# Patient Record
Sex: Female | Born: 1985 | Race: Black or African American | Hispanic: No | Marital: Single
Health system: Southern US, Community
[De-identification: ages and names within clinical notes are randomized; demographics above are authoritative.]

---

## 2010-06-12 ENCOUNTER — Emergency Department: Payer: Self-pay | Admitting: Emergency Medicine

## 2010-06-17 ENCOUNTER — Emergency Department: Payer: Self-pay | Admitting: Internal Medicine

## 2011-06-26 ENCOUNTER — Observation Stay: Payer: Self-pay

## 2011-06-26 LAB — URINALYSIS, COMPLETE
Bacteria: NONE SEEN
Leukocyte Esterase: NEGATIVE
Ph: 6 (ref 4.5–8.0)
Protein: NEGATIVE
RBC,UR: 2 /HPF (ref 0–5)
Specific Gravity: 1.013 (ref 1.003–1.030)

## 2011-06-28 LAB — URINE CULTURE

## 2011-09-21 ENCOUNTER — Observation Stay: Payer: Self-pay | Admitting: Obstetrics and Gynecology

## 2011-09-21 LAB — CBC WITH DIFFERENTIAL/PLATELET
Eosinophil %: 0.8 %
Lymphocyte #: 1.7 10*3/uL (ref 1.0–3.6)
Lymphocyte %: 15.8 %
MCV: 81 fL (ref 80–100)
Monocyte #: 1.1 x10 3/mm — ABNORMAL HIGH (ref 0.2–0.9)
Monocyte %: 10.5 %
Neutrophil #: 7.6 10*3/uL — ABNORMAL HIGH (ref 1.4–6.5)
Platelet: 204 10*3/uL (ref 150–440)
RBC: 4.11 10*6/uL (ref 3.80–5.20)
RDW: 14.7 % — ABNORMAL HIGH (ref 11.5–14.5)
WBC: 10.5 10*3/uL (ref 3.6–11.0)

## 2011-10-27 ENCOUNTER — Inpatient Hospital Stay: Payer: Self-pay

## 2011-10-27 LAB — CBC WITH DIFFERENTIAL/PLATELET
Basophil #: 0.1 10*3/uL (ref 0.0–0.1)
Basophil %: 0.6 %
Eosinophil #: 0.1 10*3/uL (ref 0.0–0.7)
Eosinophil %: 0.4 %
HCT: 34.6 % — ABNORMAL LOW (ref 35.0–47.0)
Lymphocyte %: 17 %
MCH: 26.6 pg (ref 26.0–34.0)
MCHC: 34.1 g/dL (ref 32.0–36.0)
Neutrophil %: 74.6 %
Platelet: 251 10*3/uL (ref 150–440)
WBC: 14.3 10*3/uL — ABNORMAL HIGH (ref 3.6–11.0)

## 2011-10-29 LAB — HEMATOCRIT: HCT: 32.6 % — ABNORMAL LOW (ref 35.0–47.0)

## 2012-07-12 ENCOUNTER — Emergency Department: Payer: Self-pay | Admitting: Emergency Medicine

## 2012-08-25 IMAGING — CR DG SHOULDER 3+V*L*
1 series · 3 of 3 positions shown · non-contrast
Comparison: none

REASON FOR EXAM: pain
COMMENTS:

PROCEDURE:     DXR - DXR SHOULDER LEFT COMPLETE  - June 17, 2010 [DATE]
RESULT:     Images of the left shoulder show the humeral head located in the
glenoid without evidence of fracture or foreign body.

[Series 1: view not recorded · 0.17mm/px · 3 of 3 slices shown]
[im 1/3]
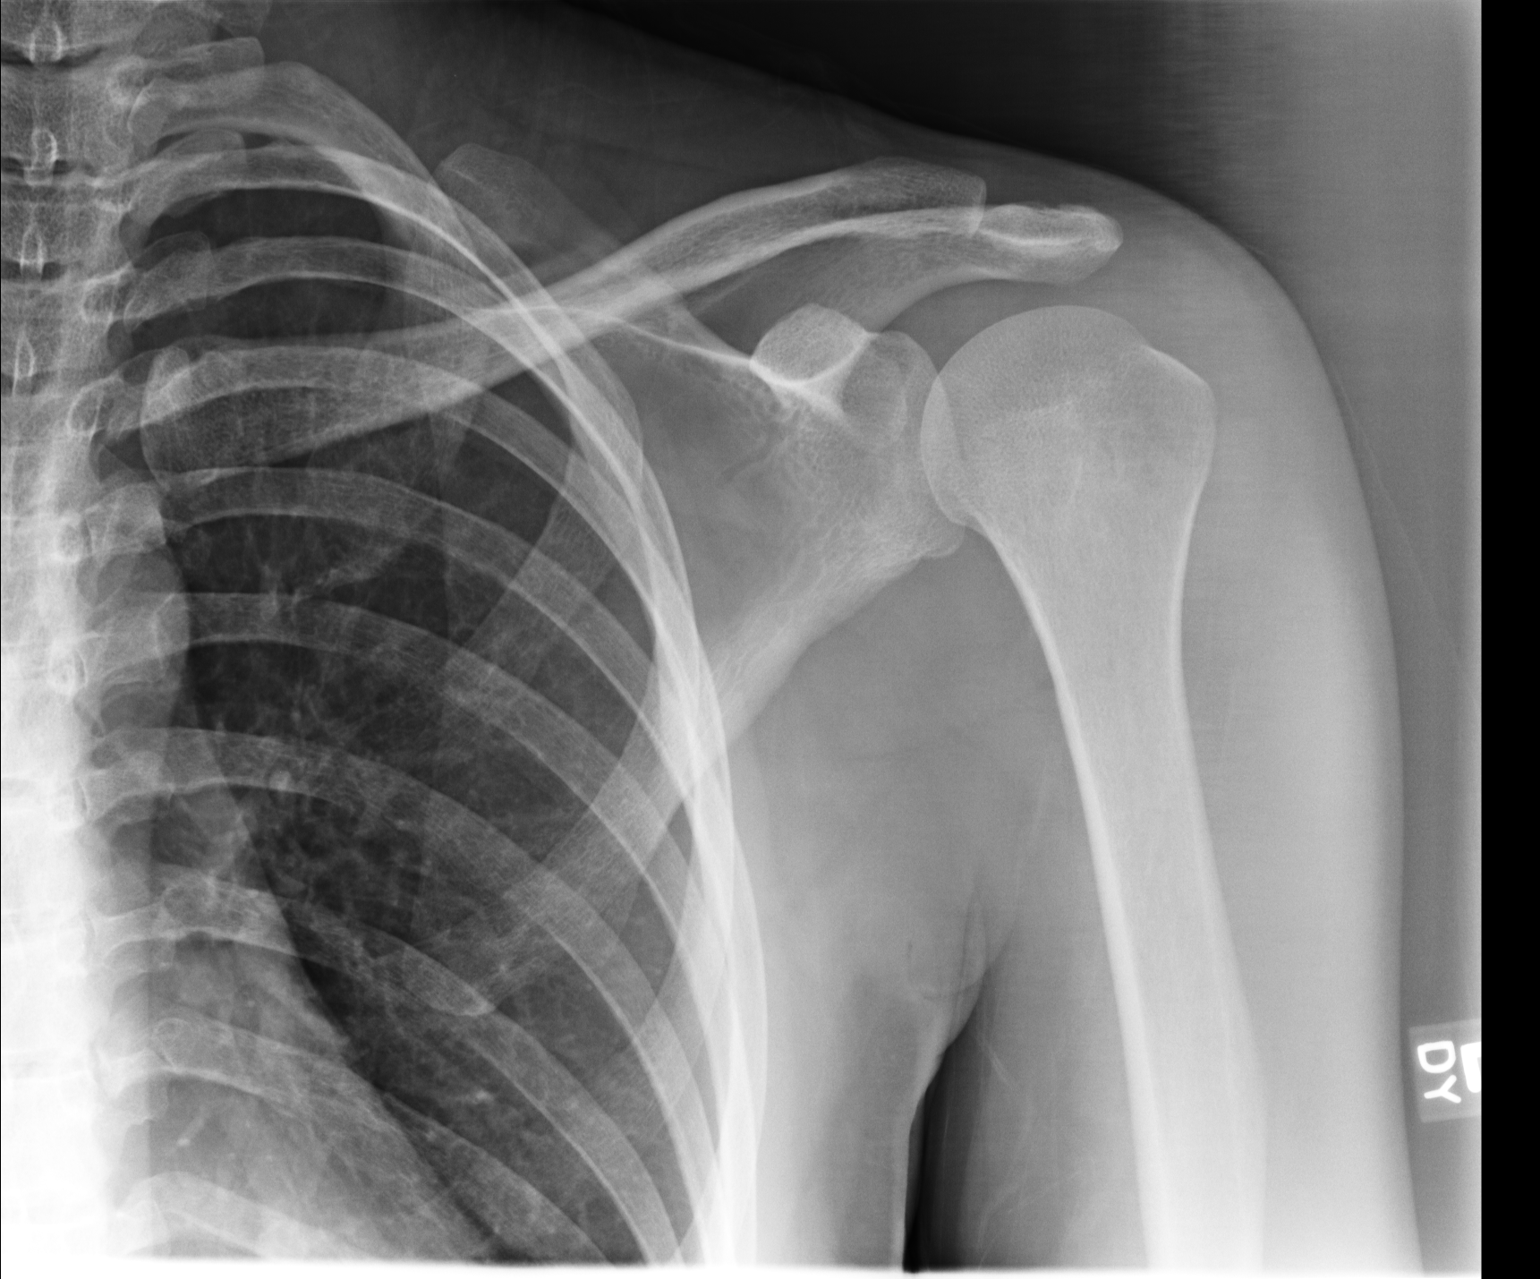
[im 2/3]
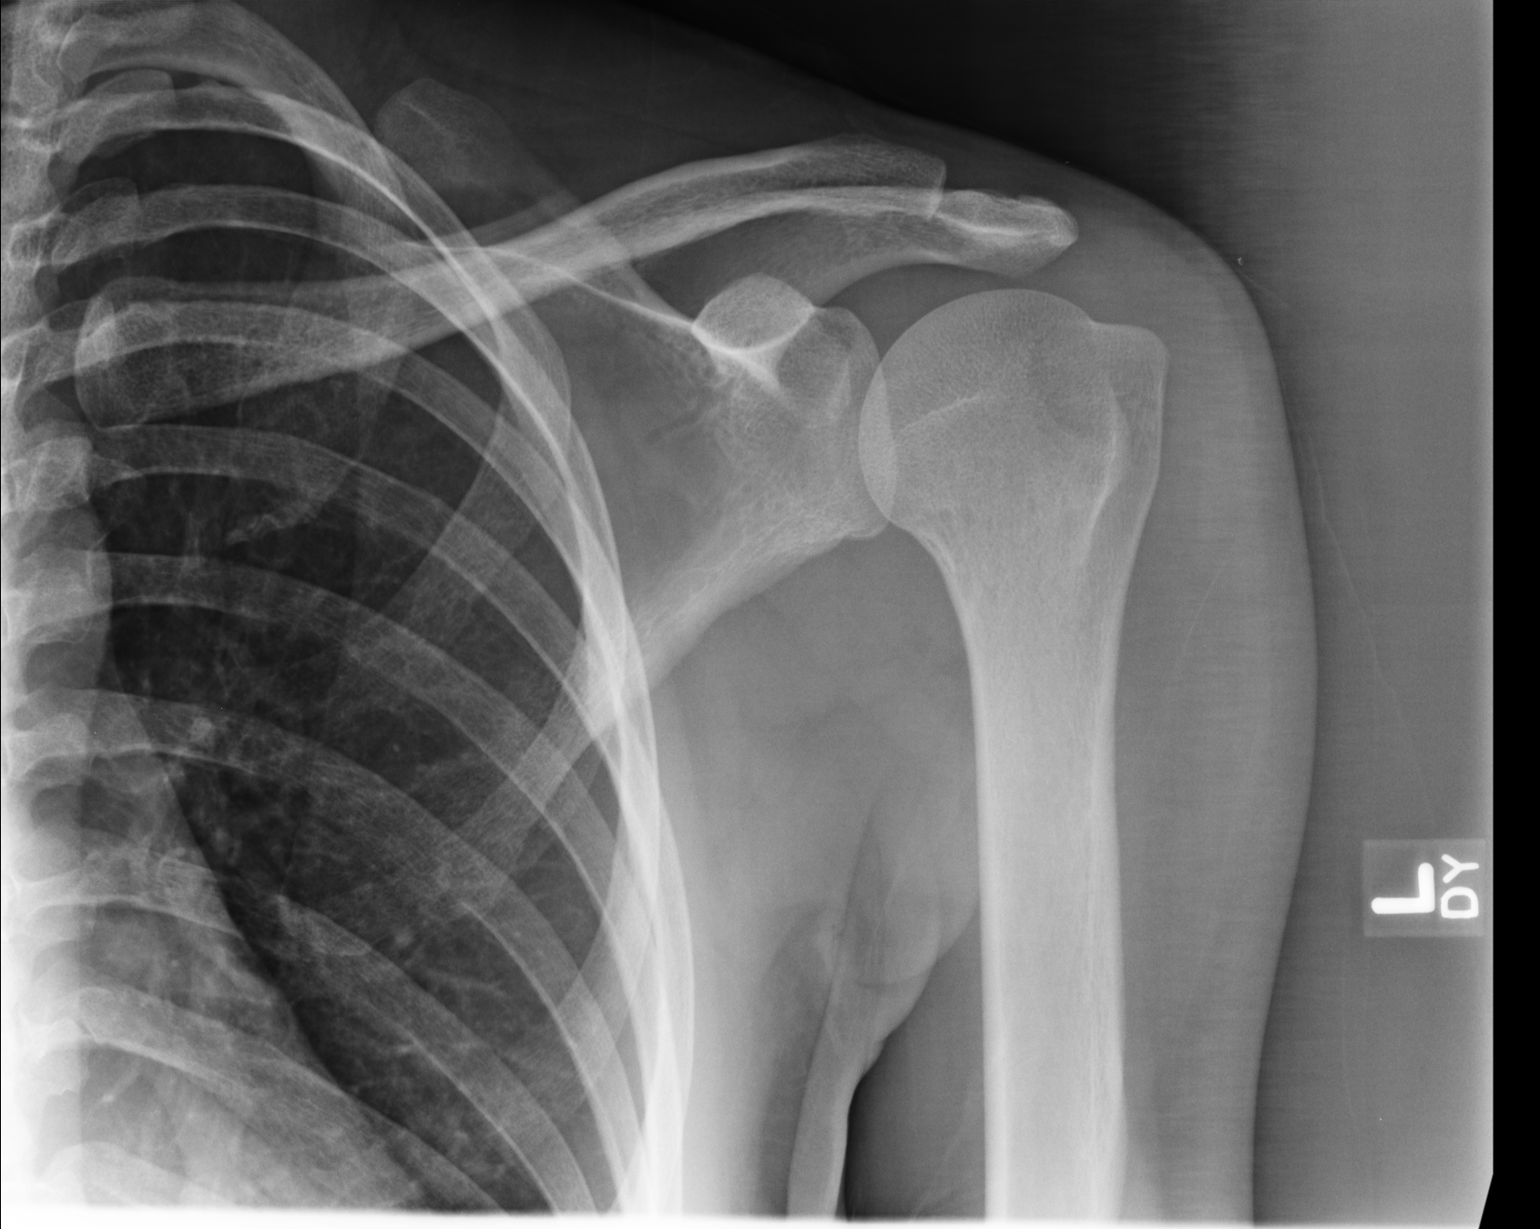
[im 3/3]
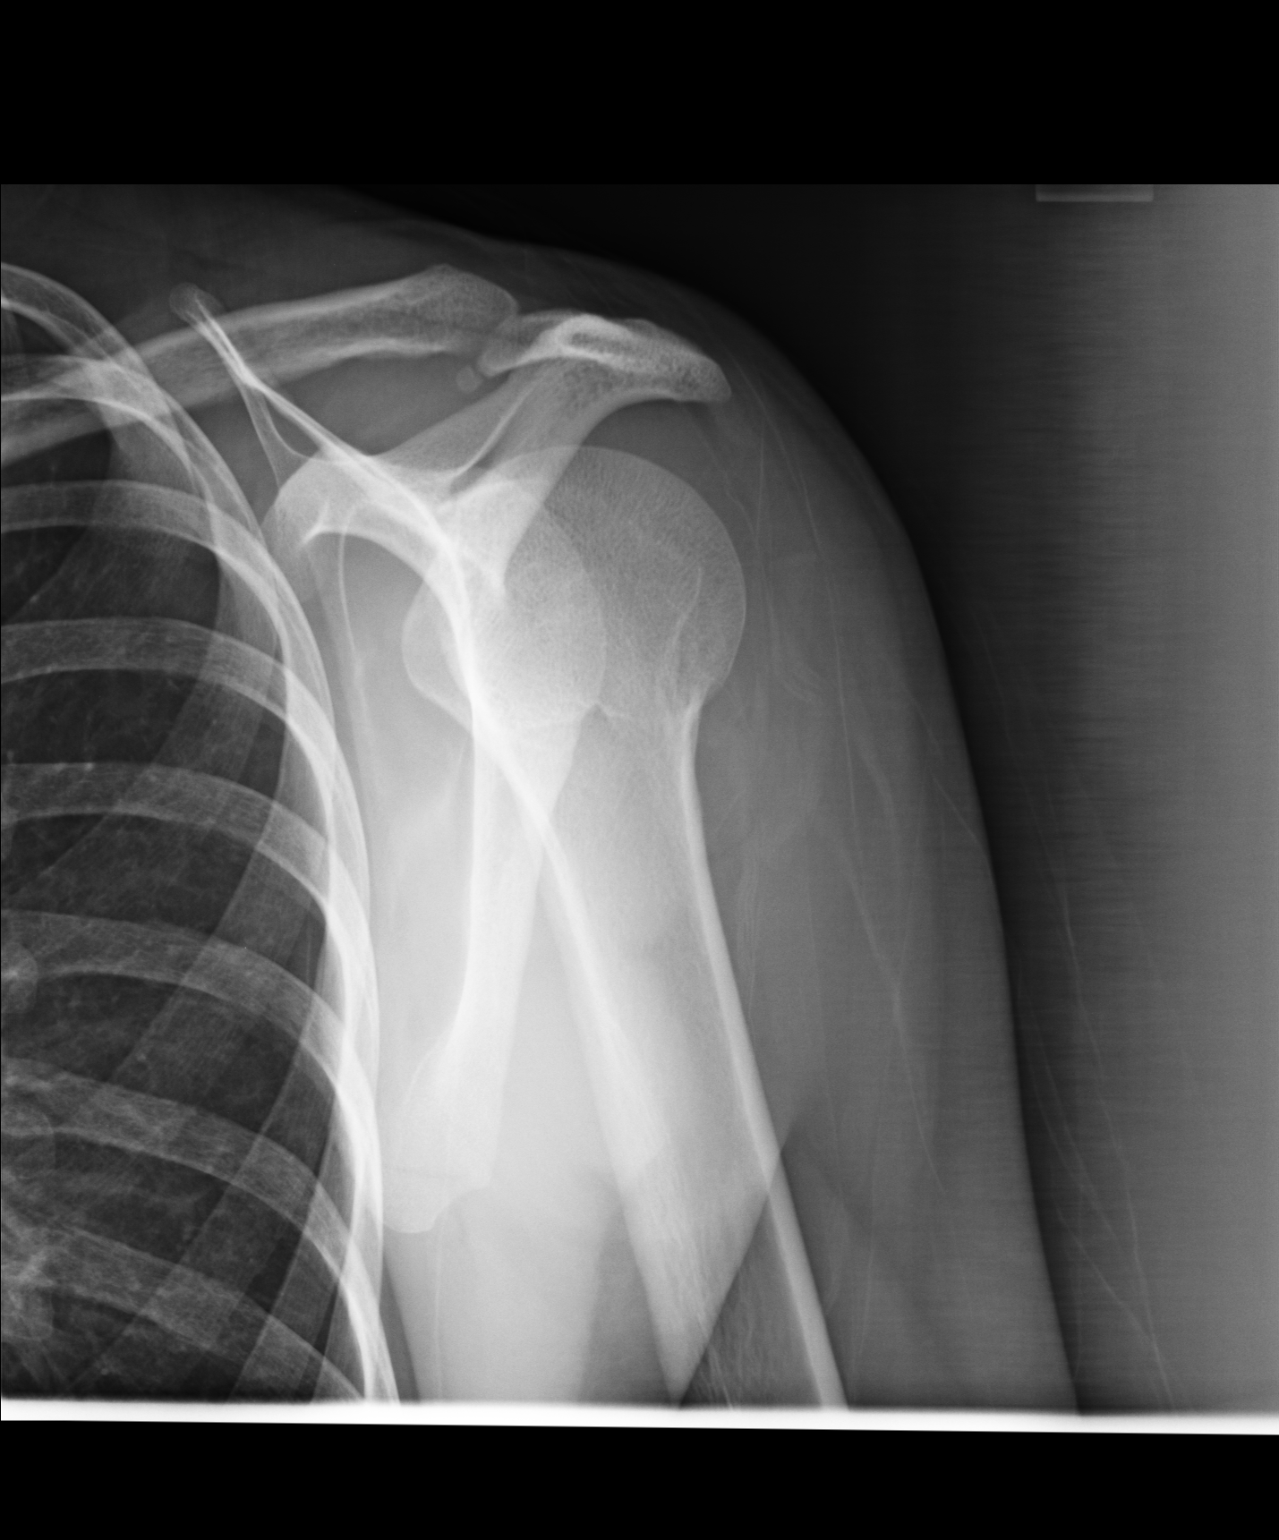

[3 of 3 positions shown; findings below may reference images not displayed]

IMPRESSION: No acute bony abnormality of the left shoulder.

## 2013-05-21 ENCOUNTER — Emergency Department: Payer: Self-pay | Admitting: Internal Medicine

## 2013-09-03 IMAGING — US US RENAL KIDNEY
1 series · 17 of 25 positions shown · non-contrast
Comparison: none

REASON FOR EXAM: IUP at 23 weeks with colicy right flank pain and recent
hematuria. R/O stones
COMMENTS:   LMP: 23 weeks pregnannt

[Series 1: us renal kidney · 17 of 30 slices shown]
[im 1/30]
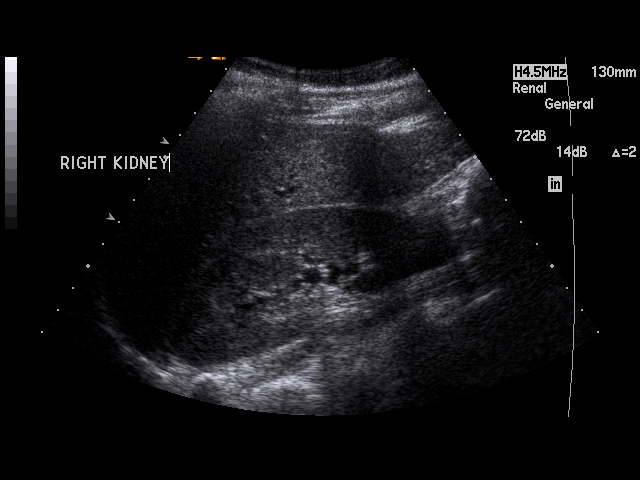
[im 3/30]
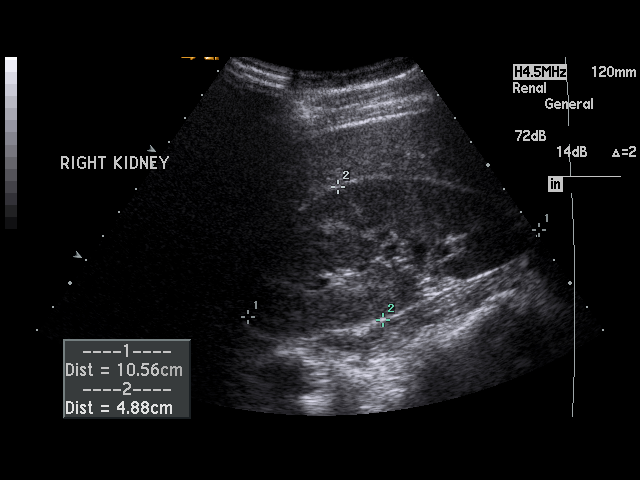
[im 4/30]
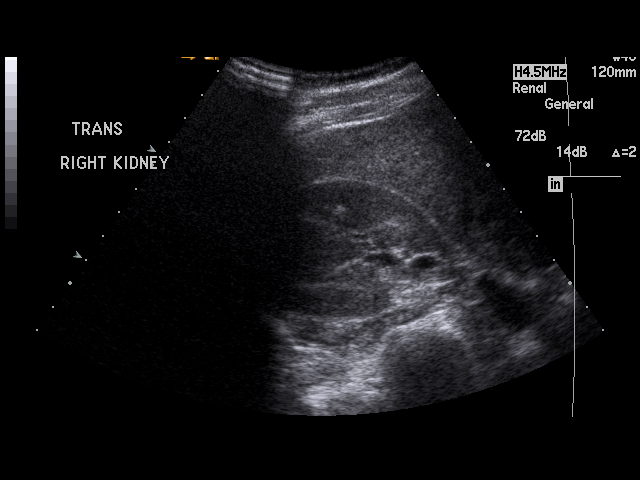
[im 7/30]
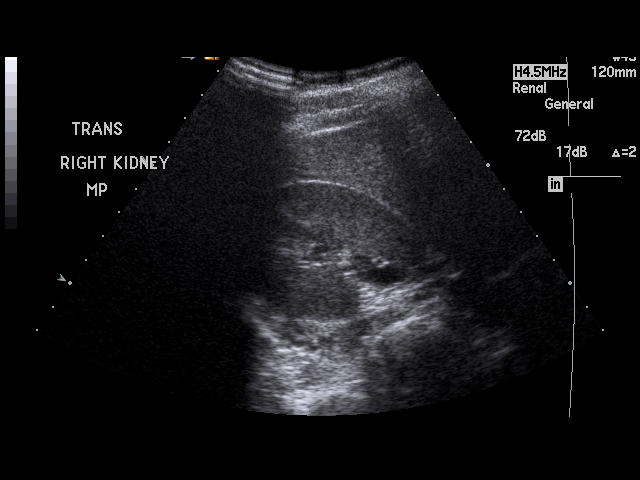
[im 8/30]
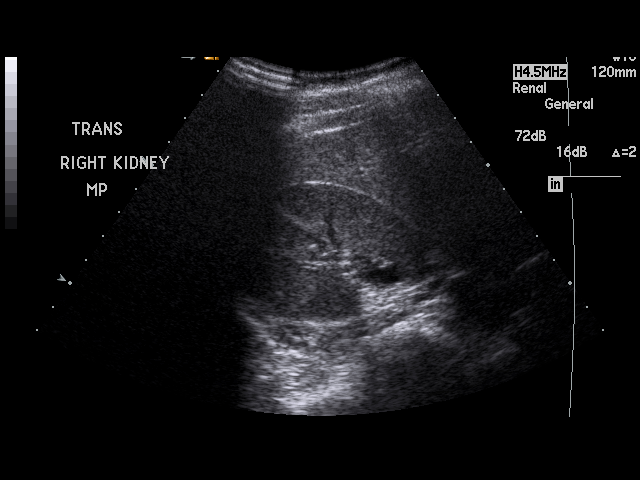
[im 10/30]
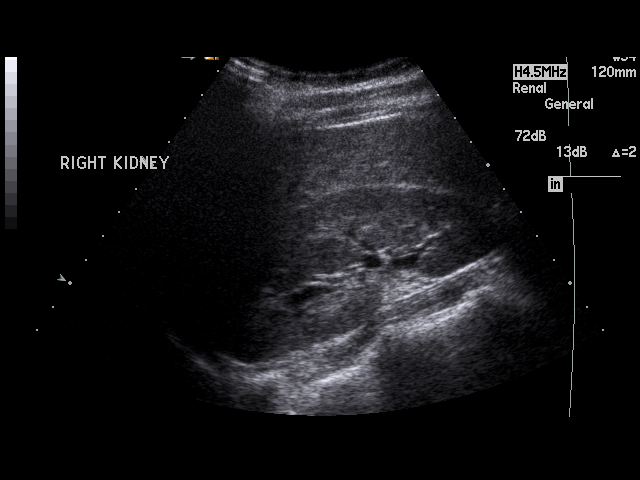
[im 11/30]
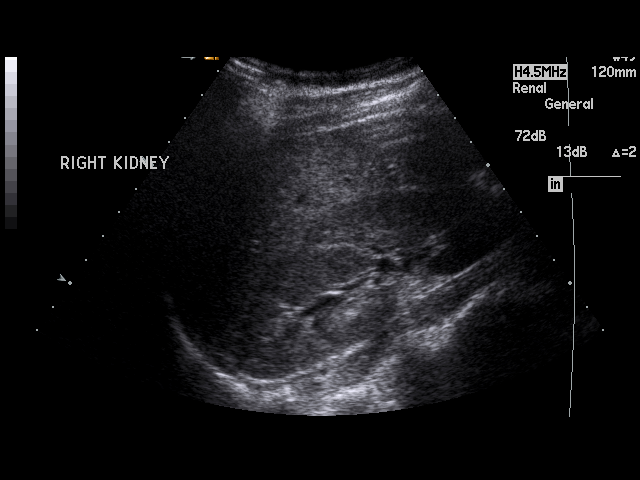
[im 14/30]
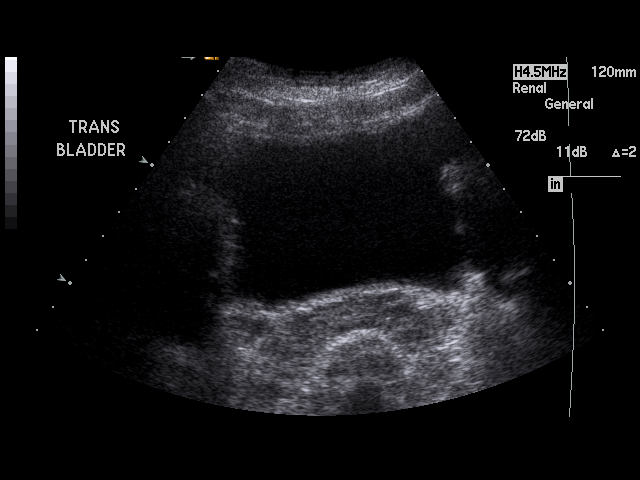
[im 15/30]
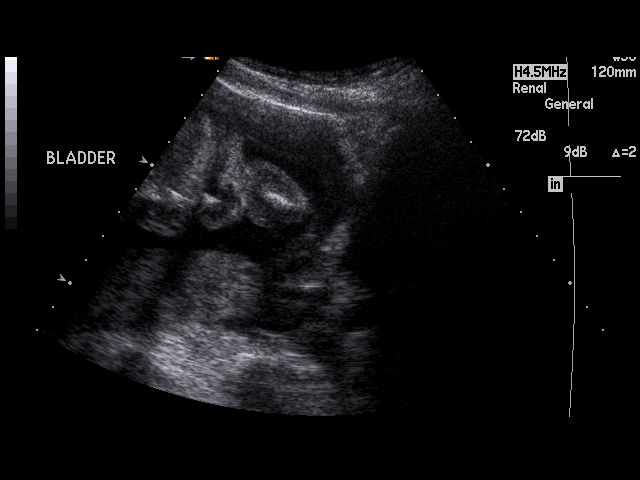
[im 16/30]
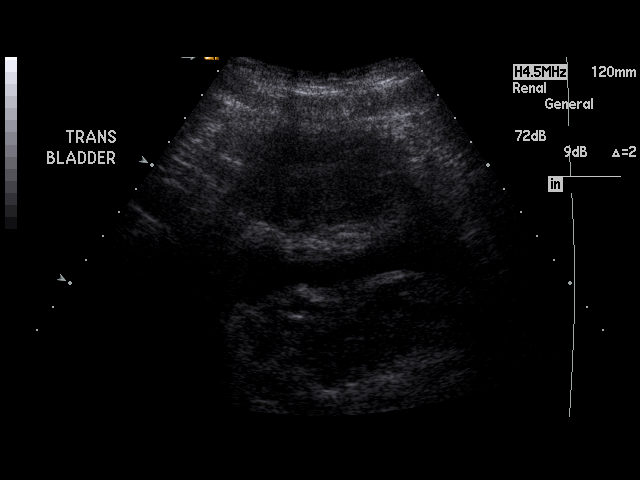
[im 19/30]
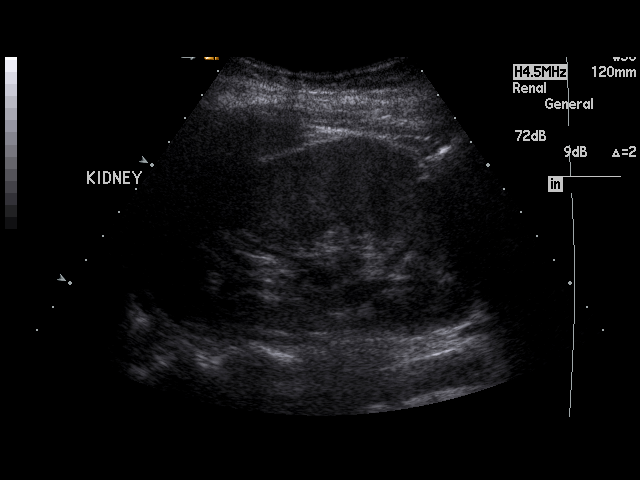
[im 20/30]
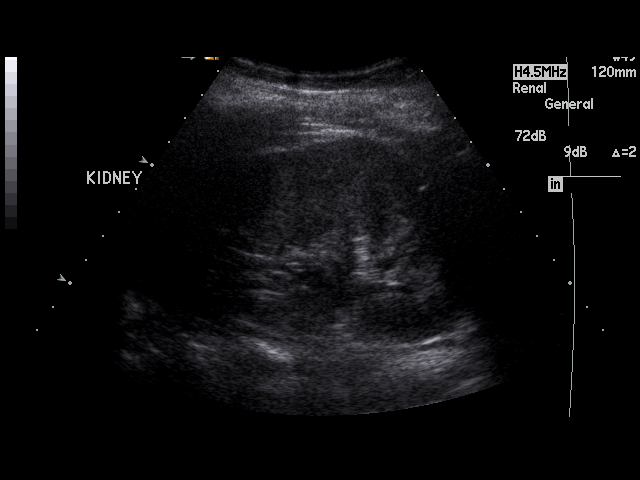
[im 22/30]
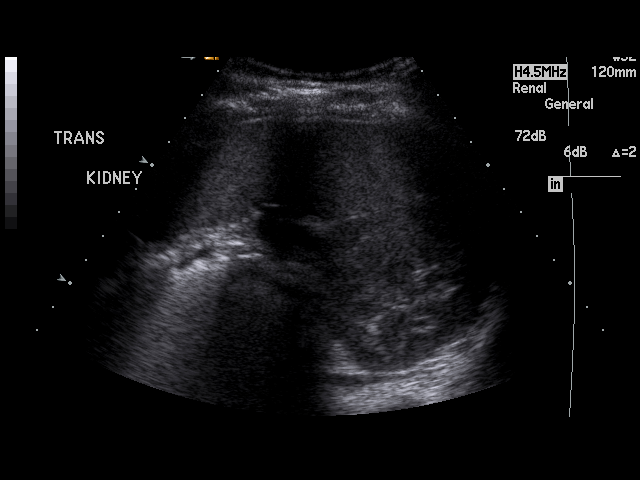
[im 23/30]
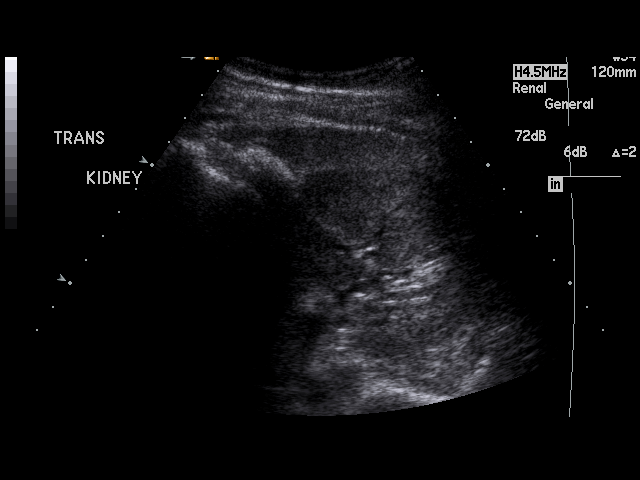
[im 26/30]
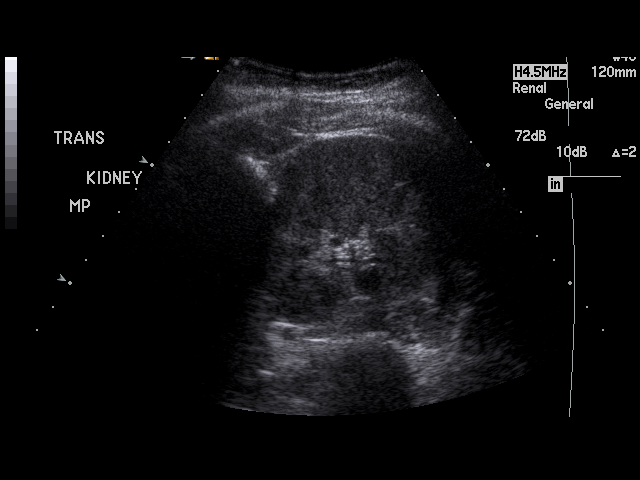
[im 27/30]
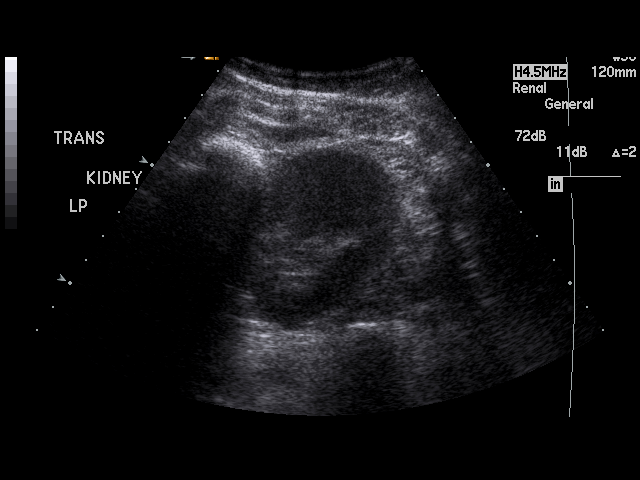
[im 30/30]
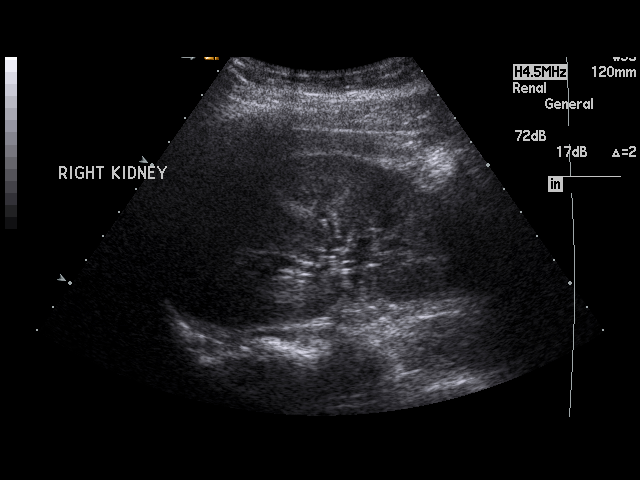

[17 of 25 positions shown; findings below may reference images not displayed]

PROCEDURE:     US  - US KIDNEY  - June 26, 2011 [DATE]

RESULT:

Emergent Renal Sonogram is performed. There is no previous study for
comparison. The right kidney measures 10.56 x 4.64 x 4.88 cm. The left
kidney measures 6.08 x 5.91 x 11.28 cm. Neither kidney demonstrates stones,
solid mass or cystic mass or obstruction. Urine is present within the
urinary bladder.
IMPRESSION: Unremarkable Renal Sonogram.

## 2014-07-18 NOTE — H&P (Signed)
L&D Evaluation:  History:   HPI 29 yo G4P1021 at 6644w0d [facility] noted to have a fetus in breech presentation on clinic visit 09/18/2011 who has elected to proceed with attempt and extneral cephalic version    Presents with Version    Patient's Medical History sickle cell trait    Patient's Surgical History LEEP    Medications Pre Natal Vitamins    Allergies naprosyn    Social History none    Family History Non-Contributory   ROS:   ROS All systems were reviewed.  HEENT, CNS, GI, GU, Respiratory, CV, Renal and Musculoskeletal systems were found to be normal.   Exam:   Vital Signs stable    General no apparent distress    Mental Status clear    Abdomen gravid, non-tender    Estimated Fetal Weight Average for gestational age, breech    Back no CVAT    Edema no edema    Mebranes Intact    FHT normal rate with no decels    Ucx absent    Other US confirm fetus in breech presentation with AFI of 11.21   Impression:   Impression [id number] at 4644w0d with fetus in breech presentation presenting for external cephalic version   Plan:   Comments - IV - T&S, CBC - Monitor fetus while awaiting labs, then monitor for 1-hr post attempt at version   Electronic Signatures: Lorrene ReidStaebler, Vermelle Cammarata M (MD)  (Signed 14-Jul-13 10:13)  Authored: L&D Evaluation   Last Updated: 14-Jul-13 10:13 by Lorrene ReidStaebler, Christiona Siddique M (MD)

## 2014-07-18 NOTE — H&P (Signed)
L&D Evaluation:  History:   HPI 29 year old G4 P1021 with EDC=10/19/2011 based on LMP and a 10 week ultrasound presented to L&D with c/o right flank pain x 3 days. Pain is colicy and comes and goes sometimes every 30 minutes. She denies fever, dysuria, urinary frequency, or a hx of urolithiasis or pyelo. Pain is not accompanied by nausea. Denies diarrhea or constipation.  Three and a half weeks ago began noticing bleeding when she urinated in the toliet as well as on the toliet paper with wiping. She would see small dime sized clots. This went on for 2 1/2 weeks, finally stopping 1 week ago. She did not see any blood in her underwear and she did not have to wear a pad. +FM. Has felt more pelvic pressure and she feels swollen around her vagina. She has had N/V since the start of her pregnancy. Had an URI last week and was taking vitamin C and Mucinex. Denies intercourse during the time she was bleeding. LABS: B POS Hx of SVD at 37 + weeks after prolonged PROM in 2007. [facility]    Presents with Right flank pain    Patient's Medical History Sickle cell trait    Patient's Surgical History LEEP    Medications none    Allergies PCN    Social History Quit smoking tobacco/MJ with pregnancy    Family History Non-Contributory   ROS:   ROS see HPI   Exam:   Vital Signs 106/46    Urine Protein negative dipstick, UA essentially WNL    General no apparent distress    Mental Status clear    Chest clear    Heart normal sinus rhythm    Abdomen gravid, tender RLQ with palpation and when displacing uterus to right.    Back no CVAT    Pelvic cervix closed and thick, mild erythema at introitus. wet prep negative for hyphae, Trich or clue cells.Marland Kitchen. BAby OOP    Mebranes Intact    FHT normal rate with no decels, 135-140    Ucx some uterine irritability   Impression:   Impression IUP at 234/7 weeks with flank pain and ?right round ligament pain   Plan:   Plan Kidney  ultrasound. po fluids.    Comments Kidney ultrasound negative for stones and hydronephrosis. Patient sleeping after ultrasound. Will discharge home and advised to follow-up next week or sooner prn worsening symptoms. urine culture pending.    Follow Up Appointment in 1 week   Electronic Signatures: Trinna BalloonGutierrez, Jonathan Corpus L (CNM)  (Signed 19-Apr-13 03:06)  Authored: L&D Evaluation   Last Updated: 19-Apr-13 03:06 by Trinna BalloonGutierrez, Annelle Behrendt L (CNM)
